# Patient Record
Sex: Female | Born: 1960 | Race: Black or African American | Hispanic: No | Smoking: Never smoker
Health system: Southern US, Community
[De-identification: ages and names within clinical notes are randomized; demographics above are authoritative.]

## PROBLEM LIST (undated history)

## (undated) DIAGNOSIS — T7840XA Allergy, unspecified, initial encounter: Secondary | ICD-10-CM

## (undated) DIAGNOSIS — N83201 Unspecified ovarian cyst, right side: Secondary | ICD-10-CM

## (undated) HISTORY — DX: Unspecified ovarian cyst, right side: N83.201

## (undated) HISTORY — PX: TUBAL LIGATION: SHX77

## (undated) HISTORY — DX: Allergy, unspecified, initial encounter: T78.40XA

---

## 1998-10-26 ENCOUNTER — Inpatient Hospital Stay (HOSPITAL_COMMUNITY): Admission: AD | Admit: 1998-10-26 | Discharge: 1998-10-29 | Payer: Self-pay | Admitting: Obstetrics and Gynecology

## 1998-12-03 ENCOUNTER — Ambulatory Visit (HOSPITAL_COMMUNITY): Admission: RE | Admit: 1998-12-03 | Discharge: 1998-12-03 | Payer: Self-pay | Admitting: *Deleted

## 2000-06-30 ENCOUNTER — Other Ambulatory Visit: Admission: RE | Admit: 2000-06-30 | Discharge: 2000-06-30 | Payer: Self-pay | Admitting: *Deleted

## 2001-09-06 ENCOUNTER — Other Ambulatory Visit: Admission: RE | Admit: 2001-09-06 | Discharge: 2001-09-06 | Payer: Self-pay | Admitting: *Deleted

## 2002-12-18 ENCOUNTER — Other Ambulatory Visit: Admission: RE | Admit: 2002-12-18 | Discharge: 2002-12-18 | Payer: Self-pay | Admitting: *Deleted

## 2004-01-10 ENCOUNTER — Other Ambulatory Visit: Admission: RE | Admit: 2004-01-10 | Discharge: 2004-01-10 | Payer: Self-pay | Admitting: *Deleted

## 2004-01-16 ENCOUNTER — Ambulatory Visit (HOSPITAL_COMMUNITY): Admission: RE | Admit: 2004-01-16 | Discharge: 2004-01-16 | Payer: Self-pay | Admitting: *Deleted

## 2005-02-15 ENCOUNTER — Other Ambulatory Visit: Admission: RE | Admit: 2005-02-15 | Discharge: 2005-02-15 | Payer: Self-pay | Admitting: Obstetrics and Gynecology

## 2005-10-08 ENCOUNTER — Other Ambulatory Visit: Admission: RE | Admit: 2005-10-08 | Discharge: 2005-10-08 | Payer: Self-pay | Admitting: Obstetrics and Gynecology

## 2006-07-21 ENCOUNTER — Encounter: Admission: RE | Admit: 2006-07-21 | Discharge: 2006-07-21 | Payer: Self-pay | Admitting: Obstetrics and Gynecology

## 2007-09-16 ENCOUNTER — Emergency Department (HOSPITAL_COMMUNITY): Admission: EM | Admit: 2007-09-16 | Discharge: 2007-09-16 | Payer: Self-pay | Admitting: Emergency Medicine

## 2007-11-22 ENCOUNTER — Encounter: Admission: RE | Admit: 2007-11-22 | Discharge: 2007-11-22 | Payer: Self-pay | Admitting: Obstetrics and Gynecology

## 2008-11-25 ENCOUNTER — Encounter: Admission: RE | Admit: 2008-11-25 | Discharge: 2008-11-25 | Payer: Self-pay | Admitting: Obstetrics and Gynecology

## 2009-02-07 ENCOUNTER — Encounter: Admission: RE | Admit: 2009-02-07 | Discharge: 2009-02-07 | Payer: Self-pay | Admitting: Obstetrics and Gynecology

## 2009-11-26 ENCOUNTER — Encounter: Admission: RE | Admit: 2009-11-26 | Discharge: 2009-11-26 | Payer: Self-pay | Admitting: Obstetrics and Gynecology

## 2010-10-09 ENCOUNTER — Encounter: Admission: RE | Admit: 2010-10-09 | Discharge: 2010-10-09 | Payer: Self-pay | Admitting: Obstetrics and Gynecology

## 2010-12-02 ENCOUNTER — Encounter
Admission: RE | Admit: 2010-12-02 | Discharge: 2010-12-02 | Payer: Self-pay | Source: Home / Self Care | Attending: Obstetrics and Gynecology | Admitting: Obstetrics and Gynecology

## 2011-09-08 LAB — DIFFERENTIAL
Basophils Absolute: 0
Basophils Relative: 0
Eosinophils Absolute: 0.1
Eosinophils Relative: 1
Neutrophils Relative %: 63

## 2011-09-08 LAB — CBC
HCT: 39.1
MCHC: 33.1
MCV: 80
Platelets: 210
RDW: 14.5 — ABNORMAL HIGH

## 2011-09-08 LAB — I-STAT 8, (EC8 V) (CONVERTED LAB)
Bicarbonate: 23.8
Glucose, Bld: 91
Potassium: 3.5
TCO2: 25
pCO2, Ven: 37.7 — ABNORMAL LOW
pH, Ven: 7.408 — ABNORMAL HIGH

## 2011-09-08 LAB — POCT I-STAT CREATININE: Operator id: 196461

## 2012-03-09 ENCOUNTER — Other Ambulatory Visit: Payer: Self-pay | Admitting: Obstetrics and Gynecology

## 2012-03-09 DIAGNOSIS — N6009 Solitary cyst of unspecified breast: Secondary | ICD-10-CM

## 2012-03-13 ENCOUNTER — Ambulatory Visit
Admission: RE | Admit: 2012-03-13 | Discharge: 2012-03-13 | Disposition: A | Discharge status: Home / Self Care | Payer: 59 | Source: Ambulatory Visit | Attending: Obstetrics and Gynecology | Admitting: Obstetrics and Gynecology

## 2012-03-13 DIAGNOSIS — N6009 Solitary cyst of unspecified breast: Secondary | ICD-10-CM

## 2012-12-29 ENCOUNTER — Encounter: Payer: Self-pay | Admitting: Gastroenterology

## 2013-01-16 ENCOUNTER — Ambulatory Visit (AMBULATORY_SURGERY_CENTER): Payer: 59

## 2013-01-16 ENCOUNTER — Encounter: Payer: Self-pay | Admitting: Gastroenterology

## 2013-01-16 VITALS — Ht 63.5 in | Wt 180.6 lb

## 2013-01-16 DIAGNOSIS — Z1211 Encounter for screening for malignant neoplasm of colon: Secondary | ICD-10-CM

## 2013-01-16 MED ORDER — MOVIPREP 100 G PO SOLR: ORAL | Status: AC

## 2013-01-30 ENCOUNTER — Encounter: Payer: Self-pay | Admitting: Gastroenterology

## 2013-01-30 ENCOUNTER — Ambulatory Visit (AMBULATORY_SURGERY_CENTER): Payer: 59 | Admitting: Gastroenterology

## 2013-01-30 VITALS — BP 140/87 | HR 70 | Temp 98.0°F | Resp 29 | Ht 63.5 in | Wt 180.0 lb

## 2013-01-30 DIAGNOSIS — Z1211 Encounter for screening for malignant neoplasm of colon: Secondary | ICD-10-CM

## 2013-01-30 MED ORDER — SODIUM CHLORIDE 0.9 % IV SOLN: 500.0000 mL | INTRAVENOUS | Status: DC

## 2013-01-30 NOTE — Patient Instructions (Signed)
YOU HAD AN ENDOSCOPIC PROCEDURE TODAY AT THE [facility]: Refer to the procedure report that was given to you for any specific questions about what was found during the examination.  If the procedure report does not answer your questions, please call your gastroenterologist to clarify.  If you requested that your care partner not be given the details of your procedure findings, then the procedure report has been included in a sealed envelope for you to review at your convenience later.  YOU SHOULD EXPECT: Some feelings of bloating in the abdomen. Passage of more gas than usual.  Walking can help get rid of the air that was put into your GI tract during the procedure and reduce the bloating. If you had a lower endoscopy (such as a colonoscopy or flexible sigmoidoscopy) you may notice spotting of blood in your stool or on the toilet paper. If you underwent a bowel prep for your procedure, then you may not have a normal bowel movement for a few days.  DIET: Your first meal following the procedure should be a light meal and then it is ok to progress to your normal diet.  A half-sandwich or bowl of soup is an example of a good first meal.  Heavy or fried foods are harder to digest and may make you feel nauseous or bloated.  Likewise meals heavy in dairy and vegetables can cause extra gas to form and this can also increase the bloating.  Drink plenty of fluids but you should avoid alcoholic beverages for 24 hours.  ACTIVITY: Your care partner should take you home directly after the procedure.  You should plan to take it easy, moving slowly for the rest of the day.  You can resume normal activity the day after the procedure however you should NOT DRIVE or use heavy machinery for 24 hours (because of the sedation medicines used during the test).    SYMPTOMS TO REPORT IMMEDIATELY: A gastroenterologist can be reached at any hour.  During normal business hours, 8:30 AM to 5:00 PM Monday through Friday,  call [phone].  After hours and on weekends, please call the GI answering service at [phone] who will take a message and have the physician on call contact you.   Following lower endoscopy (colonoscopy or flexible sigmoidoscopy):  Excessive amounts of blood in the stool  Significant tenderness or worsening of abdominal pains  Swelling of the abdomen that is new, acute  Fever of 100F or higher   FOLLOW UP: If any biopsies were taken you will be contacted by phone or by letter within the next 1-3 weeks.  Call your gastroenterologist if you have not heard about the biopsies in 3 weeks.  Our staff will call the home number listed on your records the next business day following your procedure to check on you and address any questions or concerns that you may have at that time regarding the information given to you following your procedure. This is a courtesy call and so if there is no answer at the home number and we have not heard from you through the emergency physician on call, we will assume that you have returned to your regular daily activities without incident.  SIGNATURES/CONFIDENTIALITY: You and/or your care partner have signed paperwork which will be entered into your electronic medical record.  These signatures attest to the fact that that the information above on your After Visit Summary has been reviewed and is understood.  Full responsibility of the confidentiality of   this discharge information lies with you and/or your care-partner.  Information on hemorrhoids given to you today 

## 2013-01-30 NOTE — Op Note (Signed)
[facility] [address] [place], [address]   COLONOSCOPY PROCEDURE REPORT  PATIENT: Valerie, Johns  MR#: [id number] BIRTHDATE: [birth year] , 51  yrs. old GENDER: Female ENDOSCOPIST: Meryl Dare, MD, [facility] REFERRED GE:[id number] Marcelle Overlie, M.D. PROCEDURE DATE:  01/30/2013 PROCEDURE:   Colonoscopy, screening ASA CLASS:   Class II INDICATIONS:average risk screening. MEDICATIONS: MAC sedation, administered by CRNA and propofol (Diprivan) 300mg  IV DESCRIPTION OF PROCEDURE:   After the risks benefits and alternatives of the procedure were thoroughly explained, informed consent was obtained.  A digital rectal exam revealed no abnormalities of the rectum.   The LB [id number]  endoscope was introduced through the anus and advanced to the cecum, which was identified by both the appendix and ileocecal valve. No adverse events experienced.   The quality of the prep was excellent, using MoviPrep  The instrument was then slowly withdrawn as the colon was fully examined.  COLON FINDINGS: A normal appearing cecum, ileocecal valve, and appendiceal orifice were identified.  The ascending, hepatic flexure, transverse, splenic flexure, descending, sigmoid colon and rectum appeared unremarkable.  No polyps or cancers were seen. Retroflexed views revealed small internal hemorrhoids. The time to cecum=3 minutes 26 seconds.  Withdrawal time=9 minutes 22 seconds. The scope was withdrawn and the procedure completed.  COMPLICATIONS: There were no complications.  ENDOSCOPIC IMPRESSION: 1.  Normal colon 2.  Small internal hemorrhoids  RECOMMENDATIONS: 1.  Continue current colorectal screening recommendations for "routine risk" patients with a repeat colonoscopy in 10 years.  eSigned:  Meryl Dare, MD, [facility] 01/30/2013 11:23 AM

## 2013-01-30 NOTE — Progress Notes (Signed)
Patient did not have preoperative order for IV antibiotic SSI prophylaxis. (G8918)  Patient did not experience any of the following events: a burn prior to discharge; a fall within the facility; wrong site/side/patient/procedure/implant event; or a hospital transfer or hospital admission upon discharge from the facility. (G8907)  

## 2013-01-31 ENCOUNTER — Telehealth: Payer: Self-pay | Admitting: *Deleted

## 2013-01-31 NOTE — Telephone Encounter (Signed)
  Follow up Call-  Call back number 01/30/2013  Post procedure Call Back phone  # [phone] cell  Permission to leave phone message Yes     Patient questions:  Do you have a fever, pain , or abdominal swelling? no Pain Score  0 *  Have you tolerated food without any problems? yes  Have you been able to return to your normal activities? yes  Do you have any questions about your discharge instructions: Diet   no Medications  no Follow up visit  no  Do you have questions or concerns about your Care? no  Actions: * If pain score is 4 or above: No action needed, pain <4.

## 2014-01-16 ENCOUNTER — Other Ambulatory Visit: Payer: Self-pay | Admitting: Obstetrics and Gynecology

## 2014-01-16 DIAGNOSIS — R928 Other abnormal and inconclusive findings on diagnostic imaging of breast: Secondary | ICD-10-CM

## 2014-01-21 ENCOUNTER — Ambulatory Visit
Admission: RE | Admit: 2014-01-21 | Discharge: 2014-01-21 | Disposition: A | Discharge status: Home / Self Care | Payer: 59 | Source: Ambulatory Visit | Attending: Obstetrics and Gynecology | Admitting: Obstetrics and Gynecology

## 2014-01-21 DIAGNOSIS — R928 Other abnormal and inconclusive findings on diagnostic imaging of breast: Secondary | ICD-10-CM

## 2015-09-19 IMAGING — MG MM DIAGNOSTIC UNILATERAL L
2 series · 2 of 2 positions shown · non-contrast
Comparison: [DATE] [DATE], [DATE], [DATE] [DATE], [DATE], [DATE] [DATE], [DATE],
[DATE] [DATE], [DATE]

CLINICAL DATA: Callback from screening mammogram for a possible
mass left breast

EXAM:
DIGITAL DIAGNOSTIC  LEFT MAMMOGRAM
ULTRASOUND LEFT BREAST

[L CC]
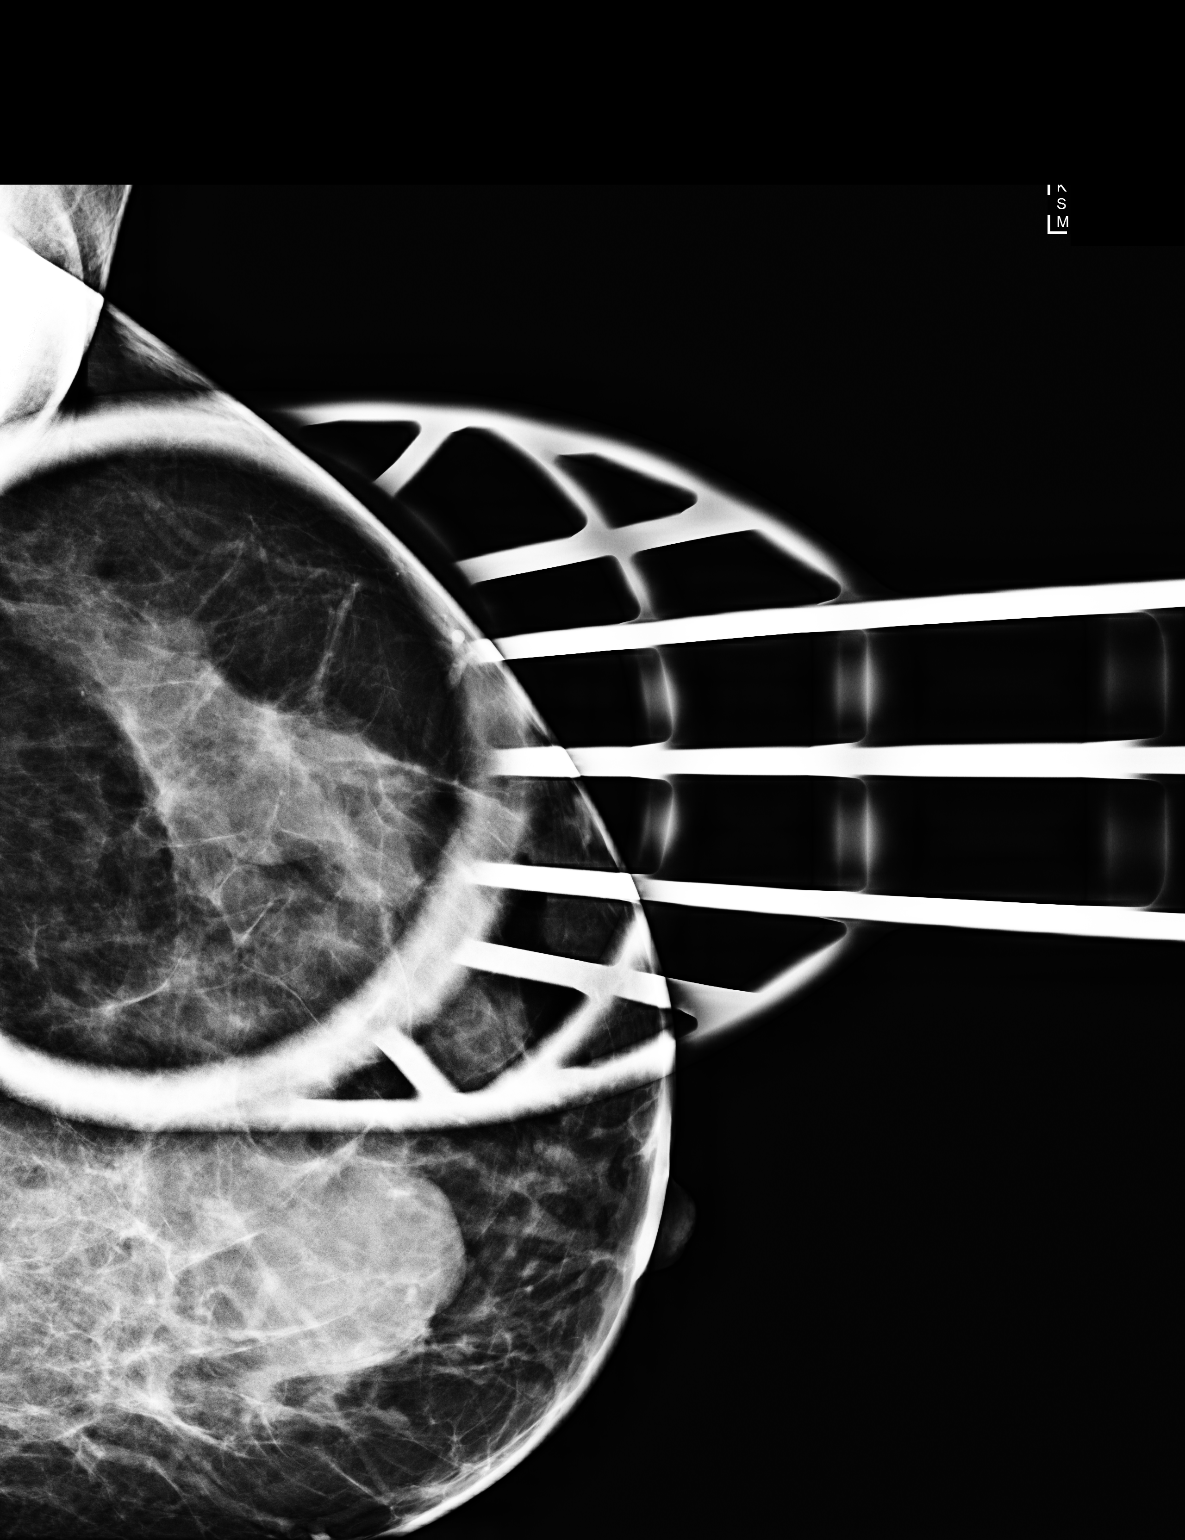

[L MLO]
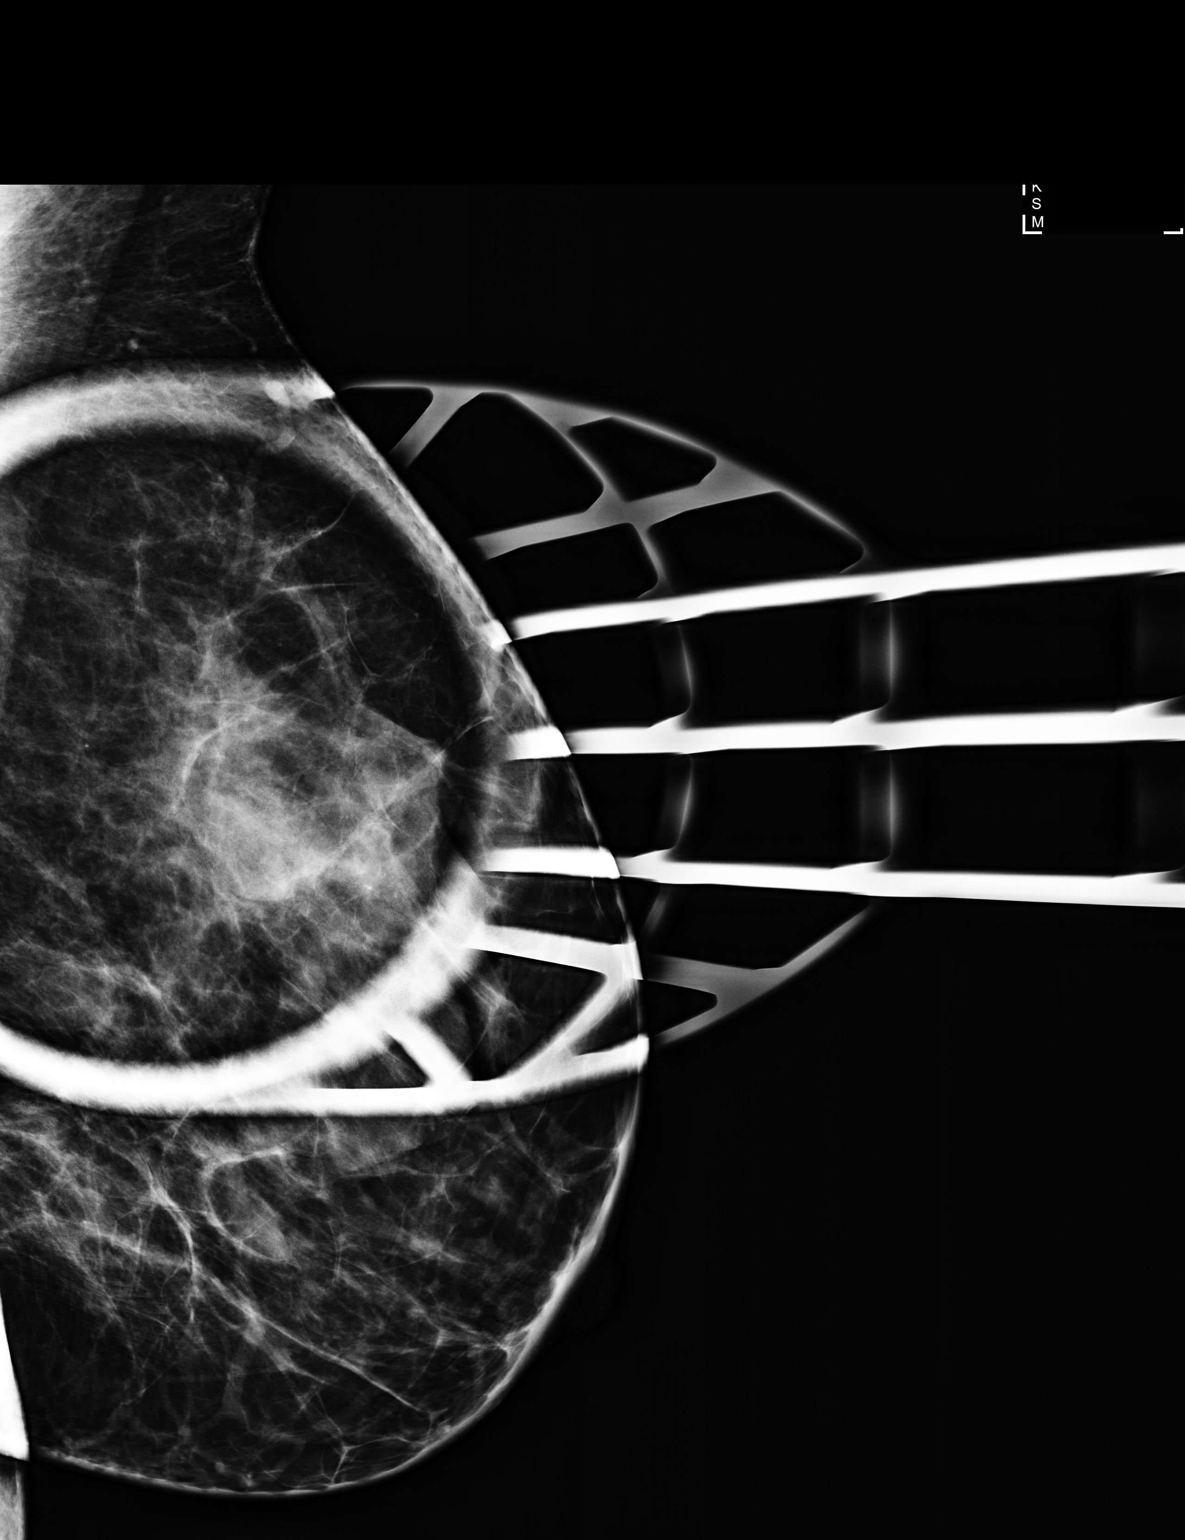

[2 of 2 positions shown; findings below may reference images not displayed]

ACR Breast Density Category c: The breast tissue is heterogeneously
dense, which may obscure small masses.
FINDINGS: Spot compression CC and MLO views of the left breast are submitted.
The previously questioned mass is not as well seen on the additional
views.

Ultrasound is performed, showing a 1.6 x 1.13 x 1.73 cm simple cyst
at the left breast 2 o'clock 8 cm from the nipple correlating to the
mammographic finding.
IMPRESSION: Benign findings.

RECOMMENDATION:
Routine screening mammogram back on schedule.

I have discussed the findings and recommendations with the patient.
Results were also provided in writing at the conclusion of the
visit. If applicable, a reminder letter will be sent to the patient
regarding the next appointment.

BI-RADS CATEGORY  2: Benign Finding(s)

## 2017-10-05 ENCOUNTER — Other Ambulatory Visit: Payer: Self-pay | Admitting: Obstetrics and Gynecology

## 2017-10-05 DIAGNOSIS — E079 Disorder of thyroid, unspecified: Secondary | ICD-10-CM

## 2017-10-11 ENCOUNTER — Ambulatory Visit
Admission: RE | Admit: 2017-10-11 | Discharge: 2017-10-11 | Disposition: A | Discharge status: Home / Self Care | Payer: 59 | Source: Ambulatory Visit | Attending: Obstetrics and Gynecology | Admitting: Obstetrics and Gynecology

## 2017-10-11 DIAGNOSIS — E079 Disorder of thyroid, unspecified: Secondary | ICD-10-CM

## 2018-11-17 IMAGING — US US THYROID
1 series · 14 of 25 positions shown · non-contrast
Comparison: None

CLINICAL DATA: Palpable thyroid abnormality on exam

EXAM:
THYROID ULTRASOUND
TECHNIQUE: Ultrasound examination of the thyroid gland and adjacent soft
tissues was performed.

[Series 1: us thyroid · 0.06mm/px · 14 of 36 slices shown]
[im 1/36]
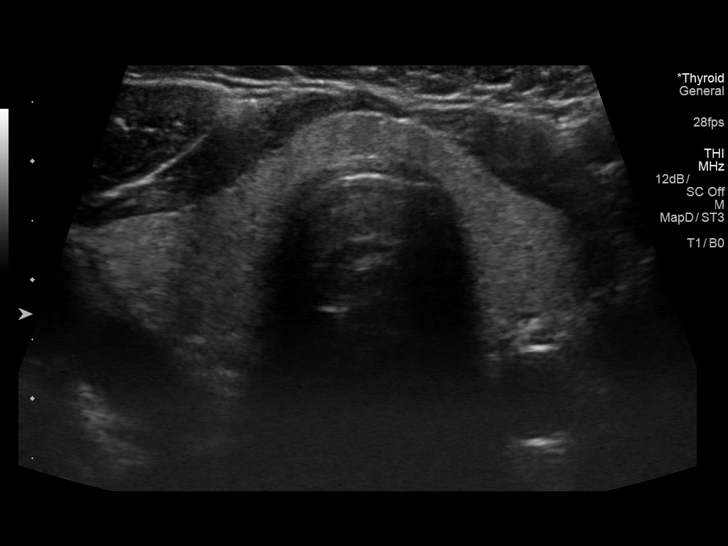
[im 3/36]
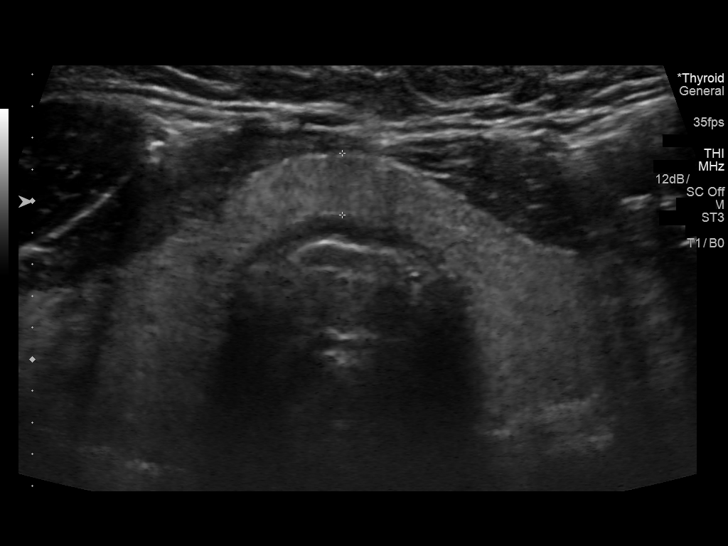
[im 6/36]
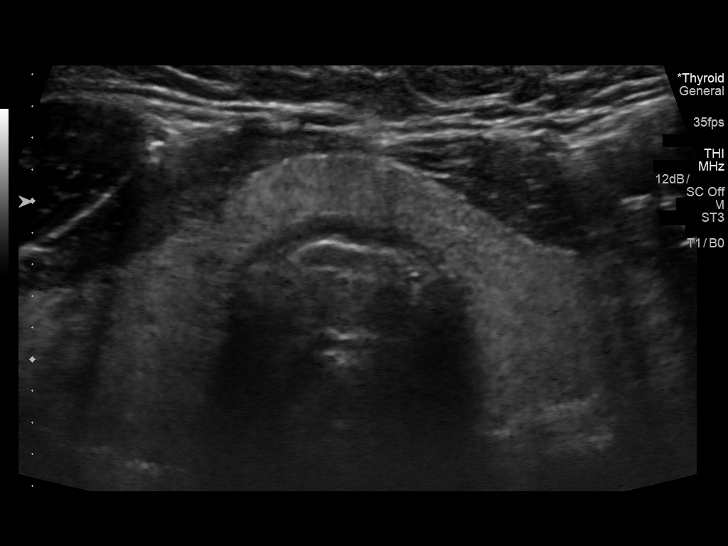
[im 9/36]
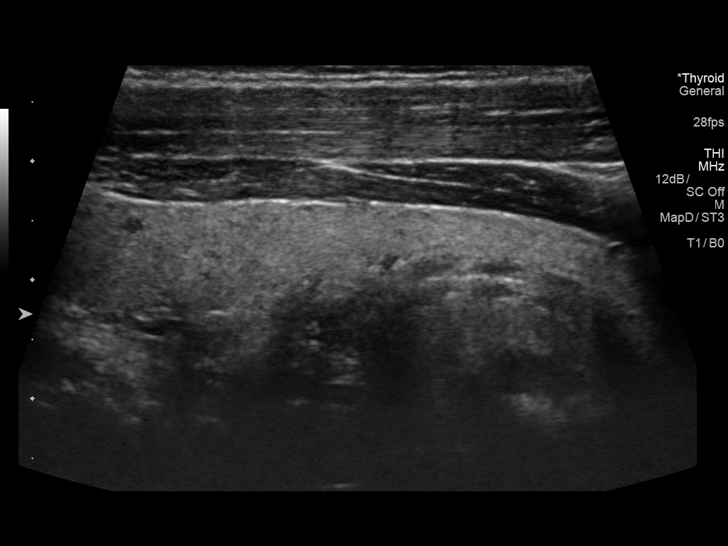
[im 12/36]
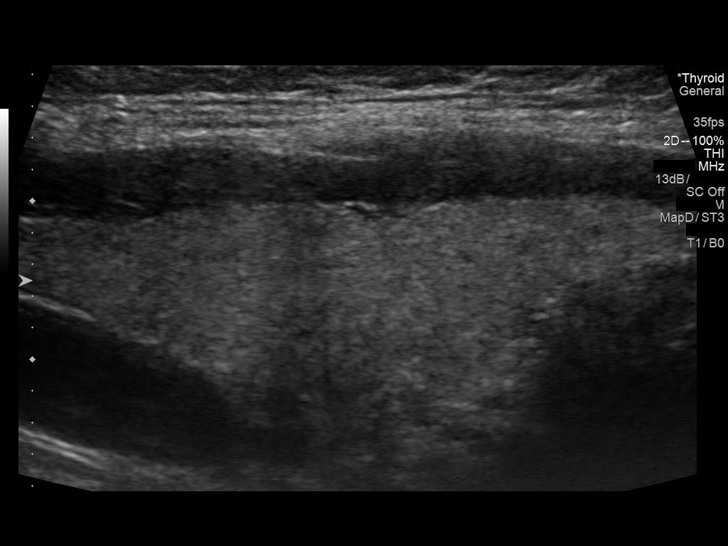
[im 14/36]
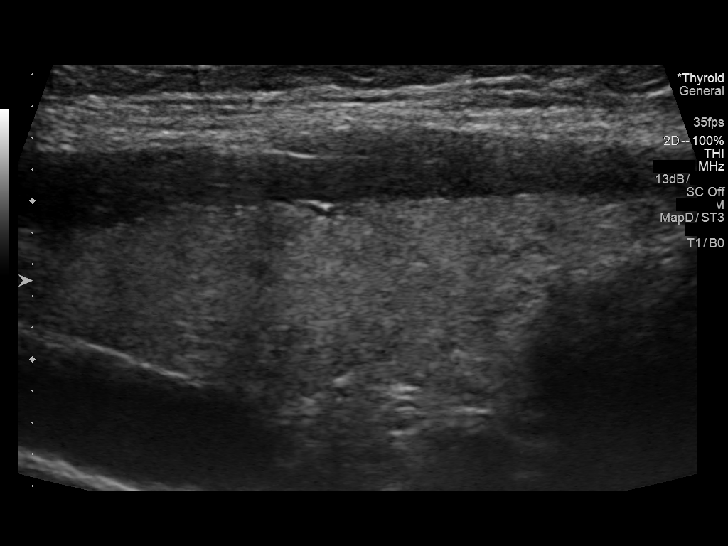
[im 17/36]
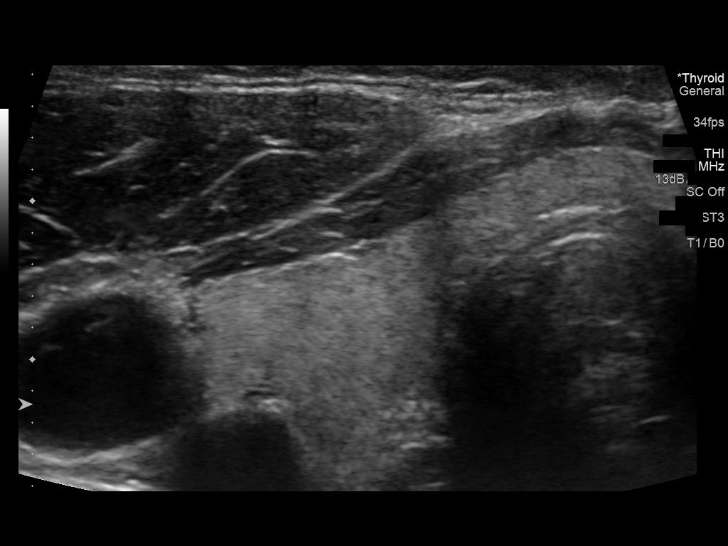
[im 19/36]
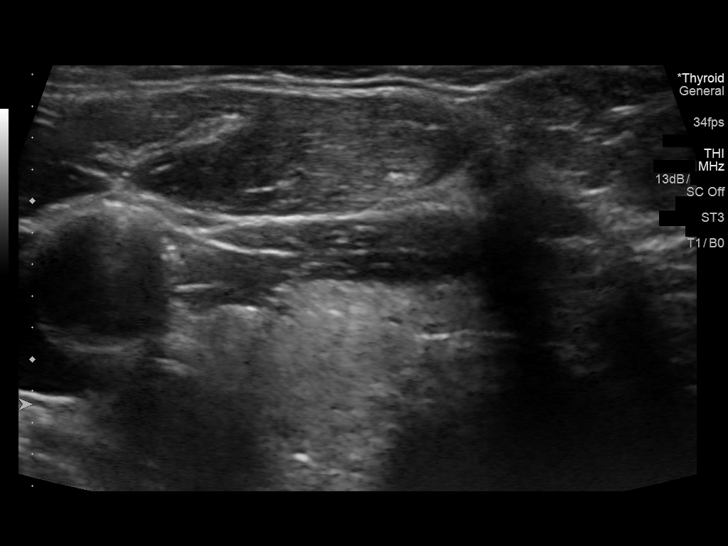
[im 22/36]
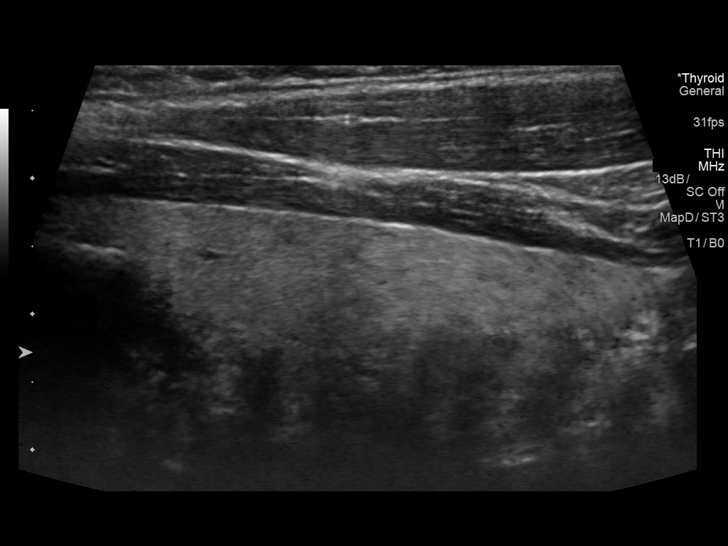
[im 24/36]
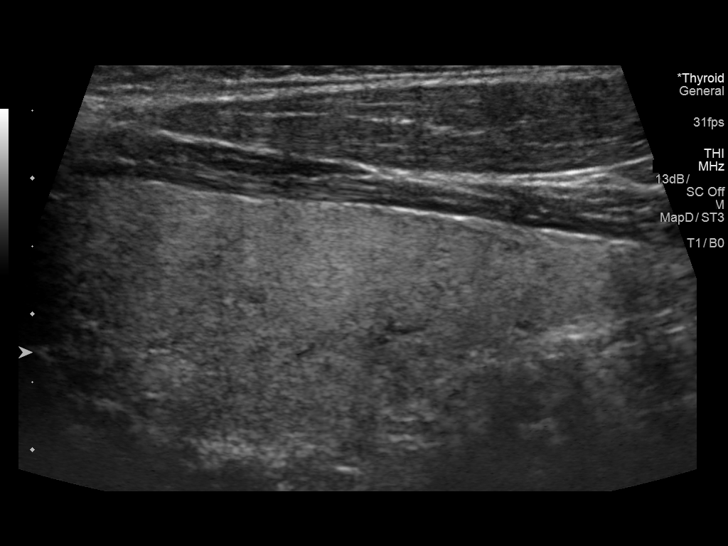
[im 27/36]
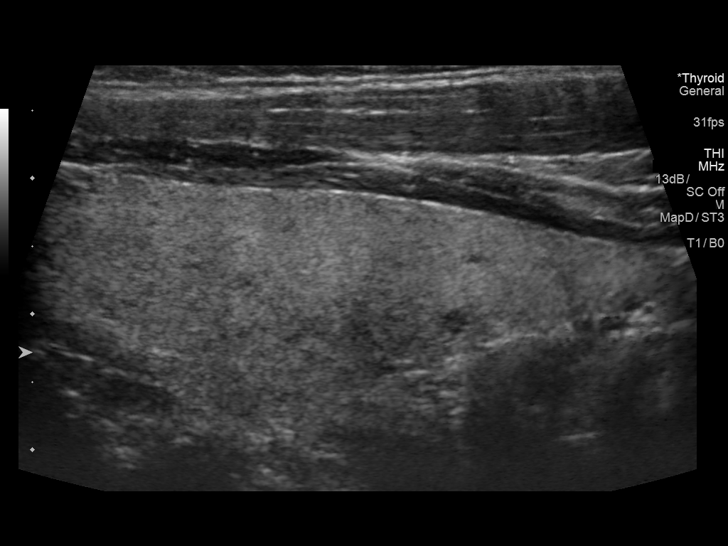
[im 30/36]
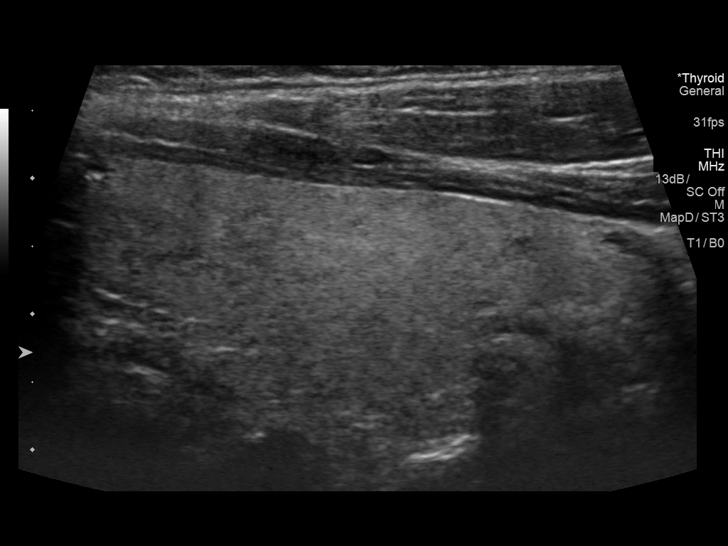
[im 33/36]
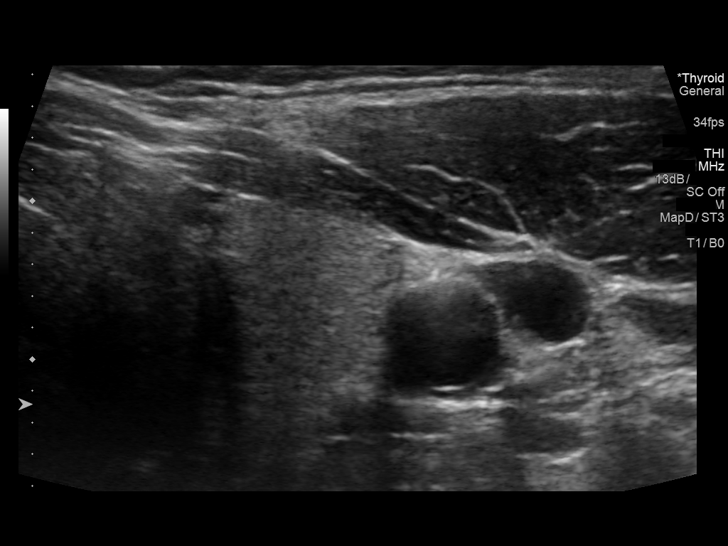
[im 36/36]
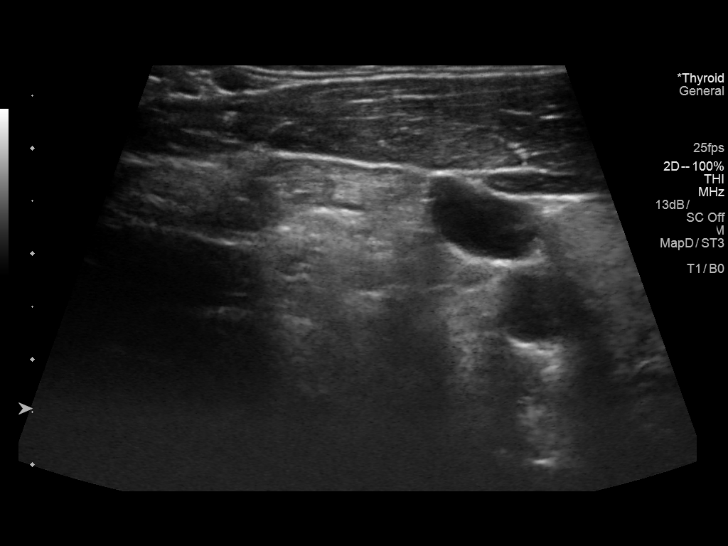

[14 of 25 positions shown; findings below may reference images not displayed]

FINDINGS: Parenchymal Echotexture: Normal

Isthmus: 4 mm

Right lobe: 4.8 x 1.4 x 1.7 cm

Left lobe: 4.8 x 1.7 x 1.7 cm

_________________________________________________________

Estimated total number of nodules >/= 1 cm: 0

Number of spongiform nodules >/=  2 cm not described below (TR1): 0

Number of mixed cystic and solid nodules >/= 1.5 cm not described
below (TR2): 0

_________________________________________________________

Incidental 3 mm hypoechoic nodule left lobe centrally, this would
not meet TI RADS criteria for any biopsy or follow-up. No other
significant thyroid abnormality. No adenopathy.
IMPRESSION: No significant thyroid abnormality by ultrasound.

The above is in keeping with the ACR TI-RADS recommendations - [HOSPITAL] 9987;[DATE].

## 2023-01-18 ENCOUNTER — Encounter: Payer: Self-pay | Admitting: Gastroenterology
